# Patient Record
Sex: Male | Born: 2005 | Race: Black or African American | Hispanic: No | Marital: Single | Smoking: Never smoker
Health system: Southern US, Community
[De-identification: ages and names within clinical notes are randomized; demographics above are authoritative.]

## PROBLEM LIST (undated history)

## (undated) ENCOUNTER — Emergency Department: Admission: EM | Payer: Self-pay | Source: Home / Self Care

---
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
## 2024-01-02 DIAGNOSIS — Z419 Encounter for procedure for purposes other than remedying health state, unspecified: Secondary | ICD-10-CM | POA: Diagnosis not present

## 2024-01-06 DIAGNOSIS — M79652 Pain in left thigh: Secondary | ICD-10-CM | POA: Diagnosis not present

## 2024-01-27 DIAGNOSIS — S93401A Sprain of unspecified ligament of right ankle, initial encounter: Secondary | ICD-10-CM | POA: Diagnosis not present

## 2024-01-30 DIAGNOSIS — Z419 Encounter for procedure for purposes other than remedying health state, unspecified: Secondary | ICD-10-CM | POA: Diagnosis not present

## 2024-02-02 DIAGNOSIS — M25571 Pain in right ankle and joints of right foot: Secondary | ICD-10-CM | POA: Diagnosis not present

## 2024-02-02 DIAGNOSIS — M25671 Stiffness of right ankle, not elsewhere classified: Secondary | ICD-10-CM | POA: Diagnosis not present

## 2024-02-09 DIAGNOSIS — S93401A Sprain of unspecified ligament of right ankle, initial encounter: Secondary | ICD-10-CM | POA: Diagnosis not present

## 2024-02-16 DIAGNOSIS — M25571 Pain in right ankle and joints of right foot: Secondary | ICD-10-CM | POA: Diagnosis not present

## 2024-02-16 DIAGNOSIS — M79652 Pain in left thigh: Secondary | ICD-10-CM | POA: Diagnosis not present

## 2024-02-16 DIAGNOSIS — M25671 Stiffness of right ankle, not elsewhere classified: Secondary | ICD-10-CM | POA: Diagnosis not present

## 2024-02-23 DIAGNOSIS — M25671 Stiffness of right ankle, not elsewhere classified: Secondary | ICD-10-CM | POA: Diagnosis not present

## 2024-02-23 DIAGNOSIS — M25571 Pain in right ankle and joints of right foot: Secondary | ICD-10-CM | POA: Diagnosis not present

## 2024-03-01 DIAGNOSIS — M25671 Stiffness of right ankle, not elsewhere classified: Secondary | ICD-10-CM | POA: Diagnosis not present

## 2024-03-01 DIAGNOSIS — M25571 Pain in right ankle and joints of right foot: Secondary | ICD-10-CM | POA: Diagnosis not present

## 2024-03-08 DIAGNOSIS — M25571 Pain in right ankle and joints of right foot: Secondary | ICD-10-CM | POA: Diagnosis not present

## 2024-03-08 DIAGNOSIS — M25671 Stiffness of right ankle, not elsewhere classified: Secondary | ICD-10-CM | POA: Diagnosis not present

## 2024-03-12 DIAGNOSIS — Z419 Encounter for procedure for purposes other than remedying health state, unspecified: Secondary | ICD-10-CM | POA: Diagnosis not present

## 2024-03-16 DIAGNOSIS — M25571 Pain in right ankle and joints of right foot: Secondary | ICD-10-CM | POA: Diagnosis not present

## 2024-03-16 DIAGNOSIS — M25671 Stiffness of right ankle, not elsewhere classified: Secondary | ICD-10-CM | POA: Diagnosis not present

## 2024-03-25 DIAGNOSIS — Z7182 Exercise counseling: Secondary | ICD-10-CM | POA: Diagnosis not present

## 2024-03-25 DIAGNOSIS — Z713 Dietary counseling and surveillance: Secondary | ICD-10-CM | POA: Diagnosis not present

## 2024-03-25 DIAGNOSIS — Z68.41 Body mass index (BMI) pediatric, 5th percentile to less than 85th percentile for age: Secondary | ICD-10-CM | POA: Diagnosis not present

## 2024-03-25 DIAGNOSIS — F909 Attention-deficit hyperactivity disorder, unspecified type: Secondary | ICD-10-CM | POA: Diagnosis not present

## 2024-03-25 DIAGNOSIS — Z0000 Encounter for general adult medical examination without abnormal findings: Secondary | ICD-10-CM | POA: Diagnosis not present

## 2024-04-11 DIAGNOSIS — Z419 Encounter for procedure for purposes other than remedying health state, unspecified: Secondary | ICD-10-CM | POA: Diagnosis not present

## 2024-05-12 DIAGNOSIS — Z419 Encounter for procedure for purposes other than remedying health state, unspecified: Secondary | ICD-10-CM | POA: Diagnosis not present

## 2024-06-11 DIAGNOSIS — Z419 Encounter for procedure for purposes other than remedying health state, unspecified: Secondary | ICD-10-CM | POA: Diagnosis not present

## 2024-07-12 DIAGNOSIS — Z419 Encounter for procedure for purposes other than remedying health state, unspecified: Secondary | ICD-10-CM | POA: Diagnosis not present

## 2024-08-12 DIAGNOSIS — Z419 Encounter for procedure for purposes other than remedying health state, unspecified: Secondary | ICD-10-CM | POA: Diagnosis not present
